# Patient Record
Sex: Male | Born: 1995 | Race: White | Hispanic: No
Health system: Southern US, Community
[De-identification: ages and names within clinical notes are randomized; demographics above are authoritative.]

---
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
## 2017-11-11 ENCOUNTER — Encounter (INDEPENDENT_AMBULATORY_CARE_PROVIDER_SITE_OTHER): Payer: No Typology Code available for payment source | Admitting: Internal Medicine

## 2018-01-24 ENCOUNTER — Telehealth (INDEPENDENT_AMBULATORY_CARE_PROVIDER_SITE_OTHER): Payer: Self-pay

## 2018-01-24 NOTE — Telephone Encounter (Signed)
Original scheduling note indicated that pt was bitten by dog and wasn't feeling well. This occurred when he was on a visit to [place] back in Sept 2019 and is having digestive issues.     Mother confirmed that after he was bitten by the dog, he had the rabies series including the immunoglobulin.

## 2018-01-27 ENCOUNTER — Ambulatory Visit (INDEPENDENT_AMBULATORY_CARE_PROVIDER_SITE_OTHER): Payer: No Typology Code available for payment source | Admitting: Internal Medicine

## 2018-01-27 VITALS — BP 102/76 | HR 85 | Temp 96.5°F | Ht 71.0 in | Wt 170.0 lb

## 2018-01-27 DIAGNOSIS — Z6823 Body mass index (BMI) 23.0-23.9, adult: Secondary | ICD-10-CM

## 2018-01-27 DIAGNOSIS — R197 Diarrhea, unspecified: Secondary | ICD-10-CM

## 2018-01-27 DIAGNOSIS — K2970 Gastritis, unspecified, without bleeding: Secondary | ICD-10-CM

## 2018-01-27 NOTE — Progress Notes (Signed)
[facility] - Initial Consultation    Referring Provider: self referred    ID/CC: Ivan James is a 22 year old year old male here for GI symptoms since travel    HPI: June 2019 started traveling [place], [place], then [place] for 7-8 weeks. URI in [place]. Food poisoning in [place] for 2 days. Since travel to [place] has had a lot of gas, loose stools. No blood in the stool. No evaluation. No recent weight loss. No fevers. No new skin rashes/lesions. No new joint pains/swelling.    Problem List:  There are no active problems to display for this patient.        ROS:  All systems have been reviewed and are negative except as documented in the HPI.      Medications:  Current Outpatient Medications   Medication Sig Dispense Refill   . DOXYCYCLINE HYCLATE OR      . Hydrocodone-Acetaminophen 5-325 MG Oral Tab Take 1 tablet by mouth every 6 hours as needed for pain. (Patient not taking: Reported on 01/27/2018) 10 tablet 0     No current facility-administered medications for this visit.        Allergies:  Review of patient's allergies indicates:  Allergies   Allergen Reactions   . Vancomycin        Social History:  3 drinks/day while travelling, less since returned  Graduated from [employer] in [place]  Working in film production, in [place]. Dad with recent diagnosis of colitis. No autoimmune issues    Physical Exam:  Vitals: BP 102/76   Pulse 85   Temp 96.5 F (35.8 C) (Temporal)   Ht 5\' 11"  (1.803 m)   Wt 170 lb (77.1 kg)   SpO2 99%   BMI 23.71 kg/m      Body mass index is 23.71 kg/m.    GEN: Comfortable, NAD  HEENT: sclerae anicteric, OP clear, no thrush, no other mucosal lesions.   CV: RRR, No MRG, PMI non-displaced, no peripheral edema  RESP: nl respiratory effort, CTAB  GI: Soft, NT, ND, +BT, no organomegaly  MSK: No joint swelling/effusion, no synovitis  SKIN: warm, dry, no wounds or rashes.   NEURO: A+Ox3,  Normal gait  PSYCH: appropriate  mood/affect  HEME/LYMPH: no supraclavicular, cervical LAD      Assessment and Plan:  Ivan James was seen today for new patient.    Diagnoses and all orders for this visit:    Diarrhea of presumed infectious origin  -     Enteric Pathogen Panel; Future  -     Extended Stool Parasite Microscopic Exam; Future    Gastritis without bleeding, unspecified chronicity, unspecified gastritis type  -     H. pylori Ag, STL; Future    Suspect either parasitic infection acquired in [place] or post-infectious IBD. Dyspepsia could be complicated by concomitant H pylori infection. Pt will collect sample and bring to [facility] lab. Leaving for [place] on 1/4- I will call with results and recommend treatment if any infection identified. If no infection, consider low FODMAPS diet for presumed post-infectious IBD. If symptoms persist, would recommend GI evaluation.

## 2018-01-27 NOTE — Patient Instructions (Signed)
--  Low FODMAPS diet

## 2018-01-31 ENCOUNTER — Other Ambulatory Visit (INDEPENDENT_AMBULATORY_CARE_PROVIDER_SITE_OTHER): Payer: Self-pay | Admitting: Internal Medicine

## 2018-01-31 ENCOUNTER — Other Ambulatory Visit
Admit: 2018-01-31 | Discharge: 2018-01-31 | Disposition: A | Discharge status: Home / Self Care | Payer: No Typology Code available for payment source | Attending: Internal Medicine | Admitting: Internal Medicine

## 2018-01-31 DIAGNOSIS — K2970 Gastritis, unspecified, without bleeding: Secondary | ICD-10-CM | POA: Insufficient documentation

## 2018-01-31 DIAGNOSIS — R197 Diarrhea, unspecified: Secondary | ICD-10-CM

## 2018-02-02 LAB — ENTERIC PATHOGENS PANEL

## 2018-02-03 ENCOUNTER — Telehealth (INDEPENDENT_AMBULATORY_CARE_PROVIDER_SITE_OTHER): Payer: Self-pay | Admitting: Internal Medicine

## 2018-02-03 LAB — EXTENDED STOOL PARASITE MICROSCOPIC EXAM

## 2018-02-03 NOTE — Telephone Encounter (Signed)
Called to inform pt of negative stool PCR test and extended parasite exam. 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 Recommend low-fodmaps diet and establish with GI specialist in [place].

## 2018-02-04 LAB — H. PYLORI AG, STL: H. Pylori Ag, Stool (Sendout): NEGATIVE

## 2019-01-26 ENCOUNTER — Ambulatory Visit: Payer: BLUE CROSS/BLUE SHIELD

## 2019-01-26 NOTE — Progress Notes
ELECTRODIAGNOSTIC EVALUATION   Criselda Peaches, MD   PHYSICAL MEDICINE/REHABILITATION   SPORTS MEDICINE                 Test Date:  01/26/2019    Patient: Roger Morales DOB: 12/15/1995 Ref Phys:    Sex: Male   ID#: [id number]       NCV FINDINGS:  Evaluation of the Right Fibular motor nerve showed reduced amplitude (Poplt, 1.2 mV) and decreased conduction velocity (Poplt-B Fib, 28 m/s).  All remaining nerves (as indicated in the following tables) were within normal limits.      EMG FINDINGS:  Needle evaluation of the Right anterior tibialis muscle showed increased insertional activity, moderately increased spontaneous activity, diminished recruitment, and decreased interference pattern.  The Right extensor hallucis longus muscle showed increased insertional activity, slightly increased spontaneous activity, diminished recruitment, and very decreased interference pattern.  All remaining muscles (as indicated in the following table) showed no evidence of electrical instability.        IMPRESSION:  The electrodiagnostic study reveals evidence of radiculopathy with acute denervation on the.  Normal electrodiagnostic study of.  There is no electrodiagnostic evidence of lumbosacral radiculopathy, peroneal neuropathy, tibial neuropathy, or peripheral neuropathy.  PLAN:  Follow up with referring physician.  Results were explained and given to the patient.        ___________________________  Criselda Peaches MD        Nerve Conduction Studies  Anti Sensory Summary Table     Stim Site NR Peak (ms) Norm Peak (ms) P-T Amp (?V) Norm P-T Amp Site1 Site2 Delta-P (ms) Dist (cm) Vel (m/s) Norm Vel (m/s)   Right Sup Fibular Anti Sensory (Ant Lat Mall)   14 cm    3.1 <4.4 19.5 >5.0 14 cm Ant Lat Mall 3.1 14.0 45 >32   Right Sural Anti Sensory (Lat Mall)   Calf    3.7 <4.0 14.3 >5.0 Calf Lat Mall 3.7 14.0 38 >35     Motor Summary Table Stim Site NR Onset (ms) Norm Onset (ms) O-P Amp (mV) Norm O-P Amp Site1 Site2 Delta-0 (ms) Dist (cm) Vel (m/s) Norm Vel (m/s)   Left Fibular Motor (Ext Dig Brev)   Ankle    5.0 <6.1 5.4 >2.5 B Fib Ankle 6.3 34.0 54 >38   B Fib    11.3  6.2  Poplt B Fib 1.6 10.0 63 >40   Poplt    12.9  5.9          Right Fibular Motor (Ext Dig Brev)   Ankle    4.4 <6.1 3.9 >2.5 B Fib Ankle 6.8 33.0 49 >38   B Fib    11.2  3.0  Poplt B Fib 3.6 10.0 28 >40   Poplt    14.8  1.2          Right Tibial Motor (Abd Hall Brev)   Ankle    4.3 <6.1 15.4 >3.0 Knee Ankle 9.8 44.0 45 >35   Knee    14.1  11.8            EMG     Side Muscle Nerve Root Ins Act Fibs Psw Amp Dur Poly Recrt Int Dennie Bible Comment   Right VastusMed Femoral L2-4 Nml Nml Nml Nml Nml 0 Nml Nml    Right BicepsFemS Sciatic L5-S1 Nml Nml Nml Nml Nml 0 Nml Nml    Right BicepsFemL Sciatic L5-S2 Nml Nml Nml Nml Nml 0 Nml Nml    Right Gastroc Tibial  S1-2 Nml Nml Nml Nml Nml 0 Nml Nml    Right AntTibialis Dp Br Fibular L4-5 Incr Nml 2+ Nml Nml 0 Reduced 50%    Right Fibularis Long Sup Br Fibular L5-S1 Nml Nml Nml Nml Nml 0 Nml Nml    Right ExtHallLong Dp Br Fibular L5, S1 Incr Nml 1+ Nml Nml 0 Reduced 25%    Right Lumbo Parasp Up Rami L1-2 Nml Nml Nml         Right Lumbo Parasp Mid Rami L3-4 Nml Nml Nml         Right Lumbo Parasp Low Rami L5-S1 Nml Nml Nml             Nerve Conduction Studies  Anti Sensory Left/Right Comparison     Stim Site L Lat (ms) R Lat (ms) L-R Lat (ms) L Amp (?V) R Amp (?V) L-R Amp (%) Site1 Site2 L Vel (m/s) R Vel (m/s) L-R Vel (m/s)   Sup Fibular Anti Sensory (Ant Lat Mall)   14 cm  3.1   19.5  14 cm Ant Lat Mall  45    Sural Anti Sensory (Lat Mall)   Calf  3.7   14.3  Calf Lat Mall  38      Motor Left/Right Comparison     Stim Site L Lat (ms) R Lat (ms) L-R Lat (ms) L Amp (mV) R Amp (mV) L-R Amp (%) Site1 Site2 L Vel (m/s) R Vel (m/s) L-R Vel (m/s)   Fibular Motor (Ext Dig Brev)   Ankle 5.0 4.4 0.6 5.4 3.9 27.8 B Fib Ankle 54 49 5 B Fib 11.3 11.2 0.1 6.2 3.0 51.6 Poplt B Fib 63 28 35   Poplt 12.9 14.8 1.9 5.9 1.2 79.7        Tibial Motor (Abd Hall Brev)   Ankle  4.3   15.4  Knee Ankle  45    Knee  14.1   11.8               Waveforms:

## 2019-01-27 ENCOUNTER — Telehealth: Payer: BLUE CROSS/BLUE SHIELD

## 2019-02-03 ENCOUNTER — Ambulatory Visit: Payer: BLUE CROSS/BLUE SHIELD

## 2019-02-03 DIAGNOSIS — L219 Seborrheic dermatitis, unspecified: Secondary | ICD-10-CM

## 2019-02-03 DIAGNOSIS — L650 Telogen effluvium: Secondary | ICD-10-CM

## 2019-02-03 MED ORDER — KETOCONAZOLE 2 % EX SHAM
3 refills | Status: AC
Start: 2019-02-03 — End: ?

## 2019-02-03 NOTE — Progress Notes
PATIENT: Roger Morales  MRN: [id number]  DOB: 13-Dec-1995  DATE OF SERVICE: 02/03/2019    Chief Complaint:   Chief Complaint   Patient presents with   ? Other     per pt hair loss         History of Present Illness: Roger Morales is a 24 y.o. male who was seen in clinic for hair loss. Noticing more hair loss over past 1 year. End of summer, early fall, scalp started to become more visible.     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   Flaking and dandruff, also mild burning sensation x6 months.   Grew hair longer.     Meds: domperidone for nausea/diarrhea since June, undergoing work up for SIBO    FH: androgenetic alopecia following injury treated with PRP injections, no other family pattern baldness    Allergies   Allergen Reactions   ? Vancomycin Hives     History reviewed. No pertinent past medical history.    ROS:  Constitutional: Denies any fevers or unintentional weight loss.  Skin/hair/nails: Negative except as documented above.     Physical Exam:  General: Well-developed, well-nourished, no acute distress.  Psychiatric: Alert and oriented, pleasant, cooperative.     Skin:  Skin type: 2  Examination of the following skin areas was performed: scalp and hair    Remarkable findings include:  Minimal bifrontal recession and occipital scalp thinning  Hair pull test with 1-2 hairs with each pull  Scalp with minimal flaking, no erythema    Labs:  Reviewed outside labs  CBC, CMP, CRP, ESR, TSH, T4 wnl  Vitamin D slightly low    Assessment and Plan:    1. Telogen effluvium  - discussed etiology and natural behavior of condition  - potential stressor is work (also having GI symptoms for which he is undergoing work up)  - reviewed labs with patient  - Referral to Psychiatry, General for stress/mood management per patient request  - monitor and treat mild seb derm    2. Seborrheic dermatitis  - ketoconazole 2% shampoo; APPLY TO SCALP EVERY OTHER DAY FOR 5 MINUTES AND RINSE.  Dispense: 120 mL; Refill: 3 RTC: 3-6 months or sooner prn    Author:  Desiree Lucy. Lauralee Evener, MD 02/03/2019 9:04 AM

## 2021-06-16 ENCOUNTER — Ambulatory Visit: Payer: BLUE CROSS/BLUE SHIELD

## 2021-06-20 ENCOUNTER — Ambulatory Visit: Payer: BLUE CROSS/BLUE SHIELD

## 2021-06-20 DIAGNOSIS — F32A Mild depression: Secondary | ICD-10-CM

## 2021-06-20 DIAGNOSIS — K580 Irritable bowel syndrome with diarrhea: Secondary | ICD-10-CM

## 2021-06-20 DIAGNOSIS — Z0000 Encounter for general adult medical examination without abnormal findings: Secondary | ICD-10-CM

## 2021-06-20 DIAGNOSIS — R61 Generalized hyperhidrosis: Secondary | ICD-10-CM

## 2021-06-20 DIAGNOSIS — Z23 Encounter for immunization: Secondary | ICD-10-CM

## 2021-06-20 LAB — CBC: MEAN PLATELET VOLUME: 11 fL (ref 9.3–13.0)

## 2021-06-20 LAB — Hgb A1c: HGB A1C - HPLC: 5.2 (ref <5.7)

## 2021-06-20 LAB — Comprehensive Metabolic Panel
CHLORIDE: 102 mmol/L (ref 96–106)
ESTIMATED GFR 2021 CKD-EPI: >89 mL/min/{1.73_m2} (ref 8–19)

## 2021-06-20 LAB — Lipid Panel: NON-HDL,CHOLESTEROL,CALC: 110 mg/dL (ref >40–<130)

## 2021-06-20 NOTE — Nursing Note
COVID Bivalent to be given verified by Roda Shutters LVN for Kimm, MA

## 2021-06-20 NOTE — Progress Notes
Annual physical    S:  26 y.o. male with below past medical history presenting for annual physical.    Concerns today:  Tends to sweat and feel hot easily  Weight loss over past few years when busy work. Now gained back and at baseline of 153  Does report inconsistent eating patterns in past    Completed film degree a few years ago at [employer]    Diet: Mixed, eating consistently  Exercise: Improved, climbing and lifting  Smoking: Quit 2 mo ago  EtOH: Occasional  Vision: Stable, no concerns  Dental: No concerns    Screening  Colon CA screening: No family hx, no concerns  Prostate: No family hx, no concerns    PHQ-9 Total Score:      06/20/2021    11:23 AM   PHQ-9 Total Score   Total Score 6        GAD-7 Total Score:      06/20/2021    11:23 AM   GAD-7 Total Score   Total Score 2        DAST-10 Total Score:      06/20/2021    11:23 AM   DAST-10 Total Score   Total Score 1        AUDIT-C Total Score:      06/20/2021    11:23 AM   Audit-C Total Score   Total Score 4            PMH  Past Medical History:   Diagnosis Date   ? 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 Irritable bowel syndrome 05/30/2018    IBS-D; treated through diet and a previous round of Xifaxan       PSH  No past surgical history on file.    All  Allergies   Allergen Reactions   ? Vancomycin Hives       Meds  Outpatient Medications Prior to Visit   Medication Sig   ? ketoconazole 2% shampoo APPLY TO SCALP EVERY OTHER DAY FOR 5 MINUTES AND RINSE. (Patient not taking: Reported on 06/20/2021.)     No facility-administered medications prior to visit.       FamHx  Family History   Problem Relation Age of Onset   ? Cancer Paternal Uncle         Melanoma       SocHx  Social History     Socioeconomic History   ? Marital status: Single   Tobacco Use   ? Smoking status: Never     Passive exposure: Never   ? Smokeless tobacco: Never   Substance and Sexual Activity   ? Alcohol use: Yes     Alcohol/week: 1.5 oz     Types: 1 Drinks Containing 1.5 oz of alcohol per week   ? Drug use: Yes     Types: Marijuana   Other Topics Concern   ? Do you exercise at least a day, 3 or more days a week? Yes     Comment: Climbing, lifting, walking/jogging   ? Do you follow a special diet? No   ? Vegan? No   ? Vegetarian? No   ? Pescatarian? No   ? Lactose Free? No   ? Gluten Free? No   ? Omnivore? No         ROS:  Denies f/c/sob/abd pain/n/v/changes in appetite/changes in bowel or bladder function.  Negative except  as stated above.      O:  BP 122/77  ~ Pulse 78  ~ Temp 36.6 ?C (97.9 ?F) (Tympanic)  ~ Resp 16  ~ Ht 5' 11'' (1.803 m)  ~ Wt 153 lb (69.4 kg)  ~ SpO2 97%  ~ BMI 21.34 kg/m?     Vitals reviewed  General: NAD, well appearing  HEENT: NC/AT, no palpable cervical lymphadenopathy, no thyromegaly, oropharynx clear, TM wnl  CV: RRR no mrg  Lungs: CTA b/l no wrr  Abd: S/NT/ND no masses  Ext: No edema, no pain  Neuro: AAOx3, no focal deficits    A/P:  26 y.o. male presenting for annual physical.  1. Routine general medical examination at a health care facility  - CBC & Platelet Count; Future  - Comprehensive Metabolic Panel; Future  - Hgb A1c; Future  - Lipid Panel; Future  - TSH with reflex FT4, FT3; Future  - HIV-1/2 Ag/Ab 4th Generation with Reflex Confirmation; Future  - HCV Antibody Screen; Future  - HBS Antigen; Future  - HBS Antigen  - HCV Antibody Screen  - HIV-1/2 Ag/Ab 4th Generation with Reflex Confirmation  - TSH with reflex FT4, FT3  - Lipid Panel  - Hgb A1c  - Comprehensive Metabolic Panel  - CBC & Platelet Count    2. Encounter for administration of vaccine  - COVID-19 Vaccine (Moderna) Bivalent BOOSTER Dose (0.5 mL), Intramuscular    3. Mild depression  - Referral to [facility]    4. Irritable bowel syndrome with diarrhea    5. Sweating increase      1. Patient is doing well today - reviewed past and current history  Routine age appropriate guidance provided  Discussed diet and exercise recs  Labs as above   2. COVID booster  3. Reviewed [facility] questionnaires. PHQ w/ mild depression. Refer to therapy.  4. Stable. Monitor.  5. Check TSH, CBC  F/u in one year or earlier as needed    The above plan of care, diagnosis, orders, and follow-up were discussed with the patient.  Questions related to this recommended plan of care were answered.

## 2021-06-21 LAB — HBs Ag: HEPATITIS B SURFACE ANTIGEN: NONREACTIVE

## 2021-06-21 LAB — TSH with reflex FT4, FT3: TSH: 0.54 u[IU]/mL (ref 0.3–4.7)

## 2021-06-21 LAB — HCV Ab Screen: HCV ANTIBODY SCREEN: NONREACTIVE

## 2021-06-21 LAB — HIV-1/2 Ag/Ab 4th Generation with Reflex Confirmation: HIV-1/2 AG/AB 4TH GENERATION WITH REFLEX CONFIRMATION: NONREACTIVE
